# Patient Record
Sex: Male | Born: 1985 | Race: Black or African American | Hispanic: No | Marital: Single | Smoking: Current every day smoker
Health system: Southern US, Community
[De-identification: ages and names within clinical notes are randomized; demographics above are authoritative.]

## PROBLEM LIST (undated history)

## (undated) DIAGNOSIS — W3400XA Accidental discharge from unspecified firearms or gun, initial encounter: Secondary | ICD-10-CM

## (undated) DIAGNOSIS — S21339A Puncture wound without foreign body of unspecified front wall of thorax with penetration into thoracic cavity, initial encounter: Secondary | ICD-10-CM

---
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
## 2015-05-09 ENCOUNTER — Emergency Department (HOSPITAL_COMMUNITY)
Admission: EM | Admit: 2015-05-09 | Discharge: 2015-05-09 | Disposition: A | Discharge status: Home / Self Care | Payer: Self-pay | Attending: Emergency Medicine | Admitting: Emergency Medicine

## 2015-05-09 ENCOUNTER — Emergency Department (HOSPITAL_COMMUNITY): Payer: Self-pay

## 2015-05-09 ENCOUNTER — Encounter (HOSPITAL_COMMUNITY): Payer: Self-pay

## 2015-05-09 DIAGNOSIS — S62336A Displaced fracture of neck of fifth metacarpal bone, right hand, initial encounter for closed fracture: Secondary | ICD-10-CM | POA: Insufficient documentation

## 2015-05-09 DIAGNOSIS — W1839XA Other fall on same level, initial encounter: Secondary | ICD-10-CM | POA: Insufficient documentation

## 2015-05-09 DIAGNOSIS — Y9367 Activity, basketball: Secondary | ICD-10-CM | POA: Insufficient documentation

## 2015-05-09 DIAGNOSIS — S62334A Displaced fracture of neck of fourth metacarpal bone, right hand, initial encounter for closed fracture: Secondary | ICD-10-CM | POA: Insufficient documentation

## 2015-05-09 DIAGNOSIS — Y998 Other external cause status: Secondary | ICD-10-CM | POA: Insufficient documentation

## 2015-05-09 DIAGNOSIS — Z720 Tobacco use: Secondary | ICD-10-CM | POA: Insufficient documentation

## 2015-05-09 DIAGNOSIS — Y92310 Basketball court as the place of occurrence of the external cause: Secondary | ICD-10-CM | POA: Insufficient documentation

## 2015-05-09 MED ORDER — OXYCODONE-ACETAMINOPHEN 5-325 MG PO TABS
1.0000 | ORAL_TABLET | Freq: Once | ORAL | Status: AC
  Administered 2015-05-09: 1 via ORAL
  Filled 2015-05-09: qty 1

## 2015-05-09 MED ORDER — OXYCODONE-ACETAMINOPHEN 5-325 MG PO TABS: 2.0000 | ORAL_TABLET | Freq: Four times a day (QID) | ORAL | Status: DC | PRN

## 2015-05-09 MED ORDER — CEPHALEXIN 500 MG PO CAPS: 1000.0000 mg | ORAL_CAPSULE | Freq: Two times a day (BID) | ORAL | Status: DC

## 2015-05-09 NOTE — ED Provider Notes (Signed)
CSN: [id number]     Arrival date & time 05/09/15  1351 History  This chart was scribed for non-physician provider Fayrene Helper, PA-C, working with Laurence Spates, MD by Phillis Haggis, ED Scribe. This patient was seen in room WTR8/WTR8 and patient care was started at 3:16 PM.   Chief Complaint  Patient presents with  . Hand Injury   The history is provided by the patient. No language interpreter was used.  HPI Comments: Jay Young is a 29 y.o. male who presents to the Emergency Department complaining of a right hand injury onset two hours ago. Pt states that he was playing basketball when he went up for a dunk and fell, landing on the dorsal surface of the right hand. He reports bleeding and swelling to the dorsum of hand and rates pain 10/10, sharp and constant. Pt states that he is right hand dominant. He denies taking anything for the pain PTA. He denies hitting head, LOC, injuries to upper arm or wrist, back pain, numbness, or weakness. He denies getting into an altercation and denies that this is a punching injury. Pt states he is UTD on tdap and denies taking blood thinners.   History reviewed. No pertinent past medical history. History reviewed. No pertinent past surgical history. History reviewed. No pertinent family history. Social History  Substance Use Topics  . Smoking status: Current Every Day Smoker -- 0.50 packs/day    Types: Cigarettes  . Smokeless tobacco: None  . Alcohol Use: No    Review of Systems  Musculoskeletal: Positive for arthralgias. Negative for back pain.  Skin: Positive for wound.  Neurological: Negative for syncope, weakness, numbness and headaches.   Allergies  Review of patient's allergies indicates not on file.  Home Medications   Prior to Admission medications   Not on File   BP 155/73 mmHg  Pulse 89  Temp(Src) 98.3 F (36.8 C) (Oral)  Resp 18  SpO2 99%  Physical Exam  Constitutional: He is oriented to person, place, and time. He  appears well-developed and well-nourished. No distress.  HENT:  Head: Normocephalic and atraumatic.  Eyes: EOM are normal. Pupils are equal, round, and reactive to light.  Neck: Normal range of motion. Neck supple.  Pulmonary/Chest: Effort normal.  Musculoskeletal: Normal range of motion.  Marked edema noted to the dorsum of hand overlying the 4th and 5th metacarpal with small skin tear, actively bleeding. Tenderness to palpation most significant to 4th and 5th metacarpal with mild crepitus. Radial pulses 2+; wrist with normal ROM. Fingers non-tender  Neurological: He is alert and oriented to person, place, and time.  Skin: Skin is warm and dry. He is not diaphoretic.  Psychiatric: He has a normal mood and affect. His behavior is normal.  Nursing note and vitals reviewed.   ED Course  Procedures (including critical care time) DIAGNOSTIC STUDIES: Oxygen Saturation is 99% on RA, normal by my interpretation.    COORDINATION OF CARE: 3:20 PM-Discussed treatment plan which includes x-ray with pt at bedside and pt agreed to plan.   3:50 PM Pt has a boxer's fracture of his R hand involving 4th-5th metarcarpal bone.  Small amount of bleeding to dorsum of hand but appears superficial, not likely an open fracture, however i will provide prophylactic abx (Keflex).  I have also consulted hand Specialist Dr. Izora Ribas who recommend ulnar gutter splint and pt can f/u in office for further care.  Pt agrees with plan.    Filed Vitals:   05/09/15 1401  BP: 155/73  Pulse: 89  Temp: 98.3 F (36.8 C)  TempSrc: Oral  Resp: 18  SpO2: 99%   Labs Review Labs Reviewed - No data to display  Imaging Review Dg Hand Complete Right  05/09/2015   CLINICAL DATA:  Basketball injury.  Pain.  EXAM: RIGHT HAND - COMPLETE 3+ VIEW  COMPARISON:  None.  FINDINGS: Comminuted fractures of the distal fourth and fifth metacarpals with angulation, consistent with boxer's fractures. Soft tissue swelling. MCP joints are  intact.  IMPRESSION: Boxer's fractures of the distal fourth and fifth metacarpals with angulation. Soft tissue swelling.   Electronically Signed   By: Elsie Stain M.D.   On: 05/09/2015 15:22      EKG Interpretation None      MDM   Final diagnoses:  None   Pt is a 29 year old male who presents to the ED following a fall and left hand injury. Pt reports 10/10 pain with laceration and swelling to the 4th and 5th metacarpals. Upon x-ray, hand shows a boxer's fracture to both metacarpals. Will call hand surgery for referral. Discussed return precautions with pt and pt agreed to plan.   BP 155/73 mmHg  Pulse 89  Temp(Src) 98.3 F (36.8 C) (Oral)  Resp 18  SpO2 99%  I have reviewed nursing notes and vital signs. I personally viewed the imaging tests through PACS system and agrees with radiologist's intepretation I reviewed available ER/hospitalization records through the EMR  I personally performed the services described in this documentation, which was scribed in my presence. The recorded information has been reviewed and is accurate.     Fayrene Helper, PA-C 05/09/15 1556  Laurence Spates, MD 05/09/15 [phone]

## 2015-05-09 NOTE — Discharge Instructions (Signed)
Please follow up closely with hand specialist Dr. Debby Bud office next week for further management of your broken hand injury.  Follow instruction below.   Boxer's Fracture Boxer's fracture is a broken bone (fracture) of the fourth or fifth metacarpal (ring or pinky finger). The metacarpal bones connect the wrist to the fingers and make up the arch of the hand. Boxer's fracture occurs toward the body (proximal) from the first knuckle. This injury is known as a boxer's fracture, because it often occurs from hitting an object with a closed fist. SYMPTOMS   Severe pain at the time of injury.  Pain and swelling around the first knuckle on the fourth or fifth finger.  Bruising (contusion) in the area within 48 hours of injury.  Visible deformity, such as a pushed down knuckle. This can occur if the fracture is complete, and the bone fragments separate enough to distort normal body shape.  Numbness or paralysis from swelling in the hand, causing pressure on the blood vessels or nerves (uncommon). CAUSES   Direct injury (trauma), such as a striking blow with the fist.  Indirect stress to the hand, such as twisting or violent muscle contraction (uncommon). RISK INCREASES WITH:  Punching an object with an unprotected knuckle.  Contact sports (football, rugby).  Sports that require hitting (boxing, martial arts).  History of bone or joint disease (osteoporosis). PREVENTION  Maintain physical fitness:  Muscle strength and flexibility.  Endurance.  Cardiovascular fitness.  For participation in contact sports, wear proper protective equipment for the hand and make sure it fits properly.  Learn and use proper technique when hitting or punching. PROGNOSIS  When proper treatment is given, to ensure normal alignment of the bones, healing can usually be expected in 4 to 6 weeks. Occasionally, surgery is necessary.  RELATED COMPLICATIONS   Bone does not heal back together  (nonunion).  Bone heals together in an improper position (malunion), causing twisting of the finger when making a fist.  Chronic pain, stiffness, or swelling of the hand.  Excessive bleeding in the hand, causing pressure and injury to nerves and blood vessels (rare).  Stopping of normal hand growth in children.  Infection of the wound, if skin is broken over the fracture (open fracture), or at the incision site if surgery is performed.  Shortening of injured bones.  Bony bump (prominence) in the palm or loss of shape of the knuckles.  Pain and weakness when gripping.  Arthritis of the affected joint, if the fracture goes into the joint, after repeated injury, or after delayed treatment.  Scarring around the knuckle, and limited motion. TREATMENT  Treatment varies, depending on the injury. The place in the hand where the injury occurs has a great deal of motion, which allows the hand to move properly. If the fracture is not aligned properly, this function may be decreased. If the bone ends are in proper alignment, treatment first involves ice and elevation of the injured hand, at or above heart level, to reduce inflammation. Pain medicines help to relieve pain. Treatment also involves restraint by splinting, bandaging, casting, or bracing for 4 or more weeks.  If the fracture is out of alignment (displaced), or it involves the joint, surgery is usually recommended. Surgery typically involves cutting through the skin to place removable pins, screws, and sometimes plates over the fracture. After surgery, the bone and joint are restrained for 4 or more weeks. After restraint (with or without surgery), stretching and strengthening exercises are needed to regain proper strength  and function of the joint. Exercises may be done at home or with the assistance of a therapist. Depending on the sport and position played, a brace or splint may be recommended when first returning to sports.  MEDICATION    If pain medication is necessary, nonsteroidal anti-inflammatory medications, such as aspirin and ibuprofen, or other minor pain relievers, such as acetaminophen, are often recommended.  Do not take pain medication for 7 days before surgery.  Prescription pain relievers may be necessary. Use only as directed and only as much as you need. COLD THERAPY Cold treatment (icing) relieves pain and reduces inflammation. Cold treatment should be applied for 10 to 15 minutes every 2 to 3 hours for inflammation and pain, and immediately after any activity that aggravates your symptoms. Use ice packs or an ice massage. SEEK MEDICAL CARE IF:   Pain, tenderness, or swelling gets worse, despite treatment.  You experience pain, numbness, or coldness in the hand.  Blue, gray, or dark color appears in the fingernails.  You develop signs of infection, after surgery (fever, increased pain, swelling, redness, drainage of fluids, or bleeding in the surgical area).  You feel you have reinjured the hand.  New, unexplained symptoms develop. (Drugs used in treatment may produce side effects.) Document Released: 08/15/2005 Document Revised: 11/07/2011 Document Reviewed: 11/27/2008 [facility] Patient Information 2015 [place], [place]. This information is not intended to replace advice given to you by your health care provider. Make sure you discuss any questions you have with your health care provider.

## 2015-05-09 NOTE — Progress Notes (Signed)
Pt stated he fell while playing basketball and has pain in his hand primarily the 4th and pinky finger. Pt is s/p a hand x-ray in the dept. Ice pack applied and pt instructed to continue to keep his hand elevated. Positive capillary refill . Fingers are cool secondary to the ice pack.

## 2015-05-09 NOTE — ED Notes (Addendum)
Pt c/o L hand pain/injury starting this afternoon.  Pain score 9/10.  Pt reports that he fell while attempting a dunk.  Pt has not taken anything for pain.  Swelling noted to posterior hand.  Small puncture wound noted to posterior hand.  Dressing applied.

## 2015-07-02 ENCOUNTER — Emergency Department (HOSPITAL_COMMUNITY): Payer: Self-pay

## 2015-07-02 ENCOUNTER — Encounter (HOSPITAL_COMMUNITY): Payer: Self-pay | Admitting: Emergency Medicine

## 2015-07-02 ENCOUNTER — Emergency Department (HOSPITAL_COMMUNITY)
Admission: EM | Admit: 2015-07-02 | Discharge: 2015-07-02 | Disposition: A | Discharge status: Home / Self Care | Payer: Self-pay | Attending: Emergency Medicine | Admitting: Emergency Medicine

## 2015-07-02 DIAGNOSIS — Z4800 Encounter for change or removal of nonsurgical wound dressing: Secondary | ICD-10-CM | POA: Insufficient documentation

## 2015-07-02 DIAGNOSIS — Z720 Tobacco use: Secondary | ICD-10-CM | POA: Insufficient documentation

## 2015-07-02 DIAGNOSIS — R05 Cough: Secondary | ICD-10-CM | POA: Insufficient documentation

## 2015-07-02 DIAGNOSIS — R079 Chest pain, unspecified: Secondary | ICD-10-CM | POA: Insufficient documentation

## 2015-07-02 DIAGNOSIS — Z792 Long term (current) use of antibiotics: Secondary | ICD-10-CM | POA: Insufficient documentation

## 2015-07-02 DIAGNOSIS — Z5189 Encounter for other specified aftercare: Secondary | ICD-10-CM

## 2015-07-02 DIAGNOSIS — R059 Cough, unspecified: Secondary | ICD-10-CM

## 2015-07-02 HISTORY — DX: Puncture wound without foreign body of unspecified front wall of thorax with penetration into thoracic cavity, initial encounter: S21.339A

## 2015-07-02 HISTORY — DX: Accidental discharge from unspecified firearms or gun, initial encounter: W34.00XA

## 2015-07-02 MED ORDER — OXYCODONE-ACETAMINOPHEN 5-325 MG PO TABS: 1.0000 | ORAL_TABLET | Freq: Four times a day (QID) | ORAL | Status: DC | PRN

## 2015-07-02 MED ORDER — OXYCODONE-ACETAMINOPHEN 5-325 MG PO TABS
2.0000 | ORAL_TABLET | Freq: Once | ORAL | Status: AC
  Administered 2015-07-02: 2 via ORAL
  Filled 2015-07-02: qty 2

## 2015-07-02 MED ORDER — IBUPROFEN 800 MG PO TABS
800.0000 mg | ORAL_TABLET | Freq: Once | ORAL | Status: AC
  Administered 2015-07-02: 800 mg via ORAL
  Filled 2015-07-02: qty 1
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
## 2015-07-16 ENCOUNTER — Emergency Department (HOSPITAL_COMMUNITY)
Admission: EM | Admit: 2015-07-16 | Discharge: 2015-07-16 | Disposition: A | Discharge status: Home / Self Care | Payer: Self-pay | Attending: Emergency Medicine | Admitting: Emergency Medicine

## 2015-07-16 ENCOUNTER — Emergency Department (HOSPITAL_COMMUNITY): Payer: Self-pay

## 2015-07-16 ENCOUNTER — Encounter (HOSPITAL_COMMUNITY): Payer: Self-pay | Admitting: Emergency Medicine

## 2015-07-16 DIAGNOSIS — Z5189 Encounter for other specified aftercare: Secondary | ICD-10-CM

## 2015-07-16 DIAGNOSIS — Z87828 Personal history of other (healed) physical injury and trauma: Secondary | ICD-10-CM | POA: Insufficient documentation

## 2015-07-16 DIAGNOSIS — F17210 Nicotine dependence, cigarettes, uncomplicated: Secondary | ICD-10-CM | POA: Insufficient documentation

## 2015-07-16 DIAGNOSIS — Z4801 Encounter for change or removal of surgical wound dressing: Secondary | ICD-10-CM | POA: Insufficient documentation
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
## 2015-07-16 NOTE — Discharge Instructions (Signed)
Wash the affected area with soap and water and apply a thin layer of topical antibiotic ointment. Do this every 12 hours.  ° °Do not use rubbing alcohol or hydrogen peroxide.                       ° °Look for signs of infection: if you see redness, if the area becomes warm, if pain increases sharply, there is discharge (pus), if red streaks appear or you develop fever or vomiting, RETURN immediately to the Emergency Department  for a recheck.  ° °Do not hesitate to return to the emergency room for any new, worsening or concerning symptoms. ° °Please obtain primary care using resource guide below. Let them know that you were seen in the emergency room and that they will need to obtain records for further outpatient management. ° ° ° °Emergency Department Resource Guide °1) Find a Doctor and Pay Out of Pocket °Although you won't have to find out who is covered by your insurance plan, it is a good idea to ask around and get recommendations. You will then need to call the office and see if the doctor you have chosen will accept you as a new patient and what types of options they offer for patients who are self-pay. Some doctors offer discounts or will set up payment plans for their patients who do not have insurance, but you will need to ask so you aren't surprised when you get to your appointment. ° °2) Contact Your Local Health Department °Not all health departments have doctors that can see patients for sick visits, but many do, so it is worth a call to see if yours does. If you don't know where your local health department is, you can check in your phone book. The CDC also has a tool to help you locate your state's health department, and many state websites also have listings of all of their local health departments. ° °3) Find a Walk-in Clinic °If your illness is not likely to be very severe or complicated, you may want to try a walk in clinic. These are popping up all over the country in pharmacies, drugstores,  and shopping centers. They're usually staffed by nurse practitioners or physician assistants that have been trained to treat common illnesses and complaints. They're usually fairly quick and inexpensive. However, if you have serious medical issues or chronic medical problems, these are probably not your best option. ° °No Primary Care Doctor: °- Call Health Connect at  832-8000 - they can help you locate a primary care doctor that  accepts your insurance, provides certain services, etc. °- Physician Referral Service- 1-800-533-3463 ° °Chronic Pain Problems: °Organization         Address  Phone   Notes  °[facility]  ([phone] Patients need to be referred by their primary care doctor.  ° °Medication Assistance: °Organization         Address  Phone   Notes  °[place] [facility] [address] °[place], [place] [address] [phone] --Must be a resident of [place] °-- Must have NO insurance coverage whatsoever (no Medicaid/ Medicare, etc.) °-- The pt. MUST have a primary care doctor that directs their care regularly and follows them in the community °  °MedAssist  (866) 331-1348   °United Way  (888) 892-1162   ° °Agencies that provide inexpensive medical care: °Organization           Address  Phone   Notes  [facility]  [phone]   [facility]    [phone]   [facility] [address] [place], [place] [address] [phone]   [facility] [address] [place], [place] [phone]   Planned Parenthood    [phone]   [facility]    [phone]   [facility]  [address], [place] Phone:  [phone], Fax:  [phone] Hours of Operation:  9 am - 6 pm, M-F.  Also accepts Medicaid/Medicare and self-pay.  [facility]  [address], [place] Phone: [phone], Fax: [phone]. Hours of Operation:  8:30 am - 5:30 pm, M-F.  Also accepts Medicaid and self-pay.  [facility] [place] [address] [place] Phone: [phone]   [facility] [address] Natasha Bence [place], [place] [phone], Ext. [phone] Mondays & Thursdays: 7-9 AM.  First 15 patients are seen on a first come, first serve basis.    Medicaid-accepting [facility] Providers:  Organization         Address  Phone   Notes  [facility] [address], [place] [phone] Also accepts self-pay patients.  [facility] [address] Laurell Josephs [place], [place]  [phone]   [facility] [address], [place] [phone]   [facility] [address] [place] [phone]   Renaye Rakers [address], [place]   [phone] Only accepts [place] patients after they have their name applied to their card.   Self-Pay (no insurance) in [facility]:  Organization         Address  Phone   Notes  Sickle Cell Patients, [facility] [address] [place], [place] [phone]   [facility] [address] [place], [place] [phone]   [facility] [place]  [address], [place] [phone]   [facility]/Dr. Osei-Bonsu  [address] [place] or [address], [place] [phone] Phone number for both [place] and [place] locations is the same.  [facility] [address] [place] [phone]   [facility] [address] [place] or [address] [phone]   [facility] [address] [place] [phone], phone; [phone], fax Sees patients 1st and 3rd Saturday of every month.  Must not qualify for public or  private insurance (i.e. Medicaid, Medicare, [place] Health Choice, Veterans' Benefits)  Household income should be no more than 200% of the poverty level The clinic cannot treat you if you are pregnant or think you are pregnant  Sexually transmitted diseases are not treated at the clinic.    Dental Care: Organization         Address  Phone  Notes  [facility] [address] [place], [place] [phone] Accepts children up to age 51 who are enrolled in [place] or [place] Health Choice; pregnant women with a Medicaid card; and children who have applied for Medicaid or  Health Choice, but were declined, whose parents can pay a reduced fee at time  of service.  [facility]  [address], [place] [phone] Accepts children up to age 61 who are enrolled in [place] or [place] Health Choice; pregnant women with a Medicaid card; and children who have applied for Medicaid or [place] Health Choice, but were declined, whose parents can pay a reduced fee at time of service.  [facility]  [address] [place], [place] [phone] Patients are seen by appointment only. Walk-ins are not accepted. [facility] will see patients 53 years of age and older. Monday - Tuesday (8am-5pm) Most Wednesdays (8:30-5pm) $30 per visit, cash only  [facility]  [address], [facility] [phone] Patients are seen by appointment only. Walk-ins are not accepted. [facility] will see patients 66 years of age and older. One Wednesday Evening (Monthly: Volunteer Based).  $30 per visit, cash only  [employer]  [phone] for adults; Children under age 30, call Graduate Pediatric Dentistry at [phone]. Children aged 85-14, please call [phone] to request a pediatric application.  Dental services are provided in all areas of dental  care including fillings, crowns and bridges, complete and partial dentures, implants, gum treatment, root canals, and extractions. Preventive care is also provided. Treatment is provided to both adults and children. Patients are selected via a lottery and there is often a waiting list.   [facility] [address], [place]  [phone] [email or web address]   [facility] [address] [place], [place] [phone], Ext. [phone] Second and Fourth Thursday of each month, opens at 6:30 AM; Clinic ends at 9 AM.  Patients are seen on a first-come first-served basis, and a limited number are seen during each clinic.   [facility]  [address] Ether Griffins [place], [place] [phone]   Eligibility Requirements You must have lived in [place], [place], or [place] counties for at least the last three months.   You cannot be eligible for state or federal sponsored [place], including [employer], [place], or [employer].   You generally cannot be eligible for healthcare insurance through your employer.    How to apply: Eligibility screenings are held every Tuesday and Wednesday afternoon from 1:00 pm until 4:00 pm. You do not need an appointment for the interview!  [facility] [address] [place], [place] [phone]   [facility] Health Department  [phone]   [facility] Health Department  [phone]   [facility] Health Department  [phone]    Behavioral Health Resources in the Community: Intensive Outpatient Programs Organization         Address  Phone  Notes  [facility] [address], [place], [place] [phone]   [facility] [address], [place], [place] [phone]   [facility] [address], [place], [place]  [phone]   [facility] [address],  [employer], [place] [phone] or [phone]    Substance Abuse Resources Organization         Address  Phone  Notes  [facility]  8195055341   [facility]  [phone]   [facility] [place]  [phone]   Floydene Flock  [phone]   Residential & Outpatient Substance Abuse Program  [phone]   Psychological Services Organization         Address  Phone  Notes  [employer] [facility]  [phone]   [facility] Services  [phone]   [facility] [address] [place] [phone] or [phone]    Mobile Crisis Teams Organization         Address  Phone  Notes  [facility], Mobile Crisis Care Unit  [phone]   [facility]  [address] [place], [place] [phone]   Doristine Locks [address] [place] [address] [place] [phone]    Self-Help/Support Groups Organization         Address  Phone             Notes  [facility] [place] - variety of support groups  336- I7437963 Call for more information  Narcotics Anonymous (NA), [facility] [address], [employer] Hope  2 meetings at this location   Statistician         Address  Phone  Notes  [facility] [address],    [place]  [phone]   [facility]  [address], [place] [address], [place], [place] [phone]   [facility] [address] [place], [place] [phone] Admissions: 8am-3pm M-F  [facility] [address]    [place], [place] [phone]   [facility] [address] [place], [place], [place] [phone]   [facility] [address]  [place], [place] [phone]   [facility] - Intensive Outpatient [address] Laurell Josephs [address], [place], [place] [phone]   [facility] ([facility] [address] [place]  [place], [place] [phone] or [phone]   [facility] ([facility]) [address], [place], [place] [phone] Accepts Medicaid  [facility] [place] [address]  [place] [phone] Substance Abuse/Addiction Treatment   [facility] Organization         Address  Phone  Notes  [facility]  2345358843   Angie Fava, PhD [address] Ervin Knack [place], [place]   [phone] or [phone]   [facility] Behavioral   [address] [place], [place] [phone]   [facility] [address], [place], [place] [phone] Insurance/Medicaid/sponsorship through [facility] and Families [address]                                    [place], [place] [phone] Therapy/tele-psych/case  [facility] [address], [place] [phone]    Dr. Lolly Mustache  [phone]   [facility]  United [employer] [facility] Dept. 1) [address] [place] 2) [address] [place] 3)  [address], [place] [phone]  [phone]   [facility] Child Abuse Hotline 929-469-9191 or 5613528226 (After Hours)
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
## 2015-07-19 ENCOUNTER — Emergency Department (HOSPITAL_COMMUNITY)
Admission: EM | Admit: 2015-07-19 | Discharge: 2015-07-19 | Disposition: A | Discharge status: Home / Self Care | Payer: Self-pay | Attending: Emergency Medicine | Admitting: Emergency Medicine

## 2015-07-19 ENCOUNTER — Encounter (HOSPITAL_COMMUNITY): Payer: Self-pay | Admitting: Emergency Medicine

## 2015-07-19 DIAGNOSIS — M25511 Pain in right shoulder: Secondary | ICD-10-CM

## 2015-07-19 DIAGNOSIS — Y230XXD Shotgun discharge, undetermined intent, subsequent encounter: Secondary | ICD-10-CM | POA: Insufficient documentation

## 2015-07-19 DIAGNOSIS — Z760 Encounter for issue of repeat prescription: Secondary | ICD-10-CM | POA: Insufficient documentation

## 2015-07-19 DIAGNOSIS — W3400XA Accidental discharge from unspecified firearms or gun, initial encounter: Secondary | ICD-10-CM

## 2015-07-19 DIAGNOSIS — S41001D Unspecified open wound of right shoulder, subsequent encounter: Secondary | ICD-10-CM | POA: Insufficient documentation

## 2015-07-19 DIAGNOSIS — F17210 Nicotine dependence, cigarettes, uncomplicated: Secondary | ICD-10-CM | POA: Insufficient documentation

## 2015-07-19 HISTORY — DX: Accidental discharge from unspecified firearms or gun, initial encounter: W34.00XA

## 2015-07-19 MED ORDER — OXYCODONE-ACETAMINOPHEN 5-325 MG PO TABS
2.0000 | ORAL_TABLET | Freq: Once | ORAL | Status: AC
  Administered 2015-07-19: 2 via ORAL
  Filled 2015-07-19: qty 2

## 2015-07-19 MED ORDER — OXYCODONE-ACETAMINOPHEN 5-325 MG PO TABS: 1.0000 | ORAL_TABLET | ORAL | Status: AC | PRN
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
## 2015-07-19 NOTE — Discharge Instructions (Signed)
You have been seen today for a medication refill. Follow up with PCP as soon as possible. Return to ED should symptoms worsen.   Emergency Department Resource Guide 1) Find a Doctor and Pay Out of Pocket Although you won't have to find out who is covered by your insurance plan, it is a good idea to ask around and get recommendations. You will then need to call the office and see if the doctor you have chosen will accept you as a new patient and what types of options they offer for patients who are self-pay. Some doctors offer discounts or will set up payment plans for their patients who do not have insurance, but you will need to ask so you aren't surprised when you get to your appointment.  2) Contact Your Local Health Department Not all health departments have doctors that can see patients for sick visits, but many do, so it is worth a call to see if yours does. If you don't know where your local health department is, you can check in your phone book. The CDC also has a tool to help you locate your state's health department, and many state websites also have listings of all of their local health departments.  3) Find a Walk-in Clinic If your illness is not likely to be very severe or complicated, you may want to try a walk in clinic. These are popping up all over the country in pharmacies, drugstores, and shopping centers. They're usually staffed by nurse practitioners or physician assistants that have been trained to treat common illnesses and complaints. They're usually fairly quick and inexpensive. However, if you have serious medical issues or chronic medical problems, these are probably not your best option.  No Primary Care Doctor: - Call Health Connect at  [phone] - they can help you locate a primary care doctor that  accepts your insurance, provides certain services, etc. - Physician Referral Service- [phone]  Chronic Pain Problems: Organization         Address  Phone    Notes  [facility]  [phone] Patients need to be referred by their primary care doctor.   Medication Assistance: Organization         Address  Phone   Notes  [facility] [address] [place], [place] [address] [phone] --Must be a resident of [place] -- Must have NO insurance coverage whatsoever (no Medicaid/ Medicare, etc.) -- The pt. MUST have a primary care doctor that directs their care regularly and follows them in the community   [facility]  [phone]   [employer]  [phone]    Agencies that provide inexpensive medical care: Organization         Address  Phone   Notes  [facility]  [phone]   [facility]    [phone]   [facility] [address] [place], [place] [address] [phone]   [facility] [address], [place] [phone]   Planned Parenthood    [phone]   [facility]    [phone]   [facility]  [address], [place] Phone:  [phone], Fax:  [phone] Hours of Operation:  9 am - 6 pm, M-F.  Also accepts Medicaid/Medicare and self-pay.  [facility]  [address], [place] Phone: [phone]  [phone], Fax: [phone]. Hours of Operation:  8:30 am - 5:30 pm, M-F.  Also accepts Medicaid and self-pay.  [facility] [place] [address] [place] Phone: [phone]   [place], [place], [place] [phone], Ext. [phone] Mondays & Thursdays: 7-9 AM.  First 15 patients are seen on a first come, first serve basis.    [place] Providers:  Organization         Address  Phone   Notes  [facility] [address], [place] [phone] Also accepts self-pay patients.  [facility]  [id number] [place], [place]  [phone]   [place], [address], [place] [phone]   [facility] [address] [place] [phone]   Lucianne Lei [address], [place]   [phone] Only accepts [place] Access [place] patients after they have their name applied to their card.   Self-Pay (no insurance) in [facility]:  Organization         Address  Phone   Notes  Sickle Cell Patients, [facility] [place] [phone]   [facility] [place] [phone]   [facility] [place]  [place], [place], [place] [phone]   [facility]/Dr. Osei-Bonsu  [address], [place] or [address], [place] [phone] Phone number for both Dent and [place] locations is the same.  [facility] [address], [place] [phone]   [facility] [address] [place] or [address] [phone]   [facility] [address] [place] [phone], phone; [phone], fax Sees patients 1st and 3rd Saturday of every month.  Must not qualify for public or private insurance (i.e. Medicaid, Medicare, [place] Health Choice, Veterans' Benefits)  Household income should be no more than 200% of the poverty level The clinic cannot treat you if you are pregnant or think you are pregnant  Sexually transmitted diseases are not treated at the clinic.    Dental Care: Organization         Address  Phone  Notes  [facility] [place] [phone] Accepts children up to age 49 who are enrolled in [place] or [place]; pregnant women with a Medicaid card; and children who have  applied for Medicaid or [place] Health Choice, but were declined, whose parents can pay a reduced fee at time of service.  [facility] Department of [facility]  [address], [place] [phone] Accepts children up to age 43 who are enrolled in [place] or [place]; pregnant women with a Medicaid card; and children who have applied for Medicaid or [place] Health Choice, but were declined, whose parents can pay a reduced fee at time of service.  [facility]  [place] [phone] Patients are seen by appointment only. Walk-ins are not accepted. [place] will see patients 75 years of age and older. Monday - Tuesday (8am-5pm) Most Wednesdays (8:30-5pm) $30 per visit, cash only  [facility]  [address], [employer]  [place] [phone] Patients are seen by appointment only. Walk-ins are not accepted. [place] will see patients 52 years of age and older. One Wednesday Evening (Monthly: Volunteer Based).  $30 per visit, cash only  [place]  [phone] for adults; Children under age 58, call Graduate Pediatric Dentistry at [phone]. Children aged 77-14, please call [phone] to request a pediatric application.  Dental services are provided in all areas of dental care including fillings, crowns and bridges, complete and partial dentures, implants, gum treatment, root canals, and extractions. Preventive care is also provided. Treatment is provided to both adults and children. Patients are selected via a lottery and there is often a waiting list.   [facility] [address], [place]  [phone] [email or web address]   [facility] [address] [place], [place] [phone], Ext. [phone] Second and Fourth Thursday of each month, opens at 6:30 AM; Clinic ends at 9 AM.  Patients are seen on a first-come first-served basis, and a  limited number are seen during each clinic.   [facility]  [address] Hillard Danker [place], [place] [phone]   Eligibility Requirements You must have lived in [place], [place], or [place] counties for at least the last three months.   You cannot be eligible for state or federal sponsored [employer], including [employer], [place], or [employer].   You generally cannot be eligible for healthcare insurance through your employer.    How to apply: Eligibility screenings are held every Tuesday and Wednesday afternoon from 1:00 pm until 4:00 pm. You do not need an appointment for the interview!  [facility] [address], [place], [place]   [place]  [place] Department  [place]  [phone]    Behavioral Health Resources in the Community: Intensive Outpatient Programs Organization         Address  Phone  Notes  [place]. [address], [place], [place] [phone]   [facility] [address] [place], [place]   [facility]: [facility] [address] [place], [place]   [place] [address]  [place], [place] or [phone]   Substance Abuse Resources Organization         Address  Phone  Notes  [facility] Drug Services  [phone]   [place]  [phone]   [facility] [place]   Chinita Pester  [phone]   Residential & Outpatient Substance Abuse Program  [phone]   Psychological Services Organization         Address  Phone  Notes  [facility] [place]  [place]  [phone]   [place] [address] [place] or [phone]    Mobile Crisis Teams Organization          Address  Phone  Notes  [facility], Mobile Crisis Care Unit  [phone]   [facility]  [address] [place], [place]   [place] [address] [place] [phone]    Self-Help/Support Groups Organization         Address  Phone             Notes  [place]. of [place] - variety of support groups  [phone] Call for more information  Narcotics Anonymous (NA), [facility] [address], [employer] [place]  2 meetings at this location   Residential Facilities manager         Address  Phone  Notes  [facility] [place],    [place]  [phone]   [facility]  [address], [place] [id number], [place], [place]   [place], [place] [phone] Admissions: 8am-3pm M-F  Incentives Substance [place] [address]    [employer], [place] [phone]   [facility] [address] [place], [place], [place]   [facility] [address],  [place], [place]   [facility] - Intensive Outpatient [address] Kristeen [address], [place], [place]   [facility] ([place].,  [place], [place] [phone] or [phone]   [facility] ([facility]) [address], [place], [place] Accepts Medicaid  [facility] [address]  [place] [phone] Substance Abuse/Addiction Treatment   [facility] Organization         Address  Phone  Notes  [facility]  [phone]   Domenic Schwab, PhD [address] Arlis Porta [place], [place]   [phone] or [phone]   [place], [place] [phone]   [facility] [address], [place], [place] [phone] Insurance/Medicaid/sponsorship  through [facility] [address]                                    [place], [place] [phone] [place] [address], [place] [phone]    Dr. Adele Schilder  [phone]   [facility] Dept. 1) [address] [place] 2) [place] 3)  [place] [address], [place] [phone]  [phone]   [place] [phone] or [phone] (After Hours)

## 2015-07-19 NOTE — ED Notes (Signed)
Patient was alert, oriented and stable upon discharge. RN went over AVS and patient had no further questions.  
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
## 2015-09-08 ENCOUNTER — Encounter (HOSPITAL_COMMUNITY): Payer: Self-pay | Admitting: *Deleted

## 2015-09-08 ENCOUNTER — Emergency Department (HOSPITAL_COMMUNITY)
Admission: EM | Admit: 2015-09-08 | Discharge: 2015-09-08 | Disposition: A | Discharge status: Home / Self Care | Payer: Self-pay | Attending: Emergency Medicine | Admitting: Emergency Medicine

## 2015-09-08 DIAGNOSIS — Y998 Other external cause status: Secondary | ICD-10-CM | POA: Insufficient documentation

## 2015-09-08 DIAGNOSIS — Y9289 Other specified places as the place of occurrence of the external cause: Secondary | ICD-10-CM | POA: Insufficient documentation

## 2015-09-08 DIAGNOSIS — Y9389 Activity, other specified: Secondary | ICD-10-CM | POA: Insufficient documentation

## 2015-09-08 DIAGNOSIS — F17210 Nicotine dependence, cigarettes, uncomplicated: Secondary | ICD-10-CM | POA: Insufficient documentation

## 2015-09-08 DIAGNOSIS — S46911A Strain of unspecified muscle, fascia and tendon at shoulder and upper arm level, right arm, initial encounter: Secondary | ICD-10-CM | POA: Insufficient documentation

## 2015-09-08 MED ORDER — IBUPROFEN 800 MG PO TABS: 800.0000 mg | ORAL_TABLET | Freq: Three times a day (TID) | ORAL | Status: AC

## 2015-09-08 MED ORDER — IBUPROFEN 800 MG PO TABS
800.0000 mg | ORAL_TABLET | Freq: Once | ORAL | Status: AC
  Administered 2015-09-08: 800 mg via ORAL
  Filled 2015-09-08: qty 1

## 2015-09-08 NOTE — Discharge Instructions (Signed)
Muscle Strain °A muscle strain is an injury that occurs when a muscle is stretched beyond its normal length. Usually a small number of muscle fibers are torn when this happens. Muscle strain is rated in degrees. First-degree strains have the least amount of muscle fiber tearing and pain. Second-degree and third-degree strains have increasingly more tearing and pain.  °Usually, recovery from muscle strain takes 1-2 weeks. Complete healing takes 5-6 weeks.  °CAUSES  °Muscle strain happens when a sudden, violent force placed on a muscle stretches it too far. This may occur with lifting, sports, or a fall.  °RISK FACTORS °Muscle strain is especially common in athletes.  °SIGNS AND SYMPTOMS °At the site of the muscle strain, there may be: °· Pain. °· Bruising. °· Swelling. °· Difficulty using the muscle due to pain or lack of normal function. °DIAGNOSIS  °Your health care provider will perform a physical exam and ask about your medical history. °TREATMENT  °Often, the best treatment for a muscle strain is resting, icing, and applying cold compresses to the injured area.   °HOME CARE INSTRUCTIONS  °· Use the PRICE method of treatment to promote muscle healing during the first 2-3 days after your injury. The PRICE method involves: °· Protecting the muscle from being injured again. °· Restricting your activity and resting the injured body part. °· Icing your injury. To do this, put ice in a plastic bag. Place a towel between your skin and the bag. Then, apply the ice and leave it on from 15-20 minutes each hour. After the third day, switch to moist heat packs. °· Apply compression to the injured area with a splint or elastic bandage. Be careful not to wrap it too tightly. This may interfere with blood circulation or increase swelling. °· Elevate the injured body part above the level of your heart as often as you can. °· Only take over-the-counter or prescription medicines for pain, discomfort, or fever as directed by your  health care provider. °· Warming up prior to exercise helps to prevent future muscle strains. °SEEK MEDICAL CARE IF:  °· You have increasing pain or swelling in the injured area. °· You have numbness, tingling, or a significant loss of strength in the injured area. °MAKE SURE YOU:  °· Understand these instructions. °· Will watch your condition. °· Will get help right away if you are not doing well or get worse. °  °This information is not intended to replace advice given to you by your health care provider. Make sure you discuss any questions you have with your health care provider. °  °Document Released: 08/15/2005 Document Revised: 06/05/2013 Document Reviewed: 03/14/2013 °Elsevier Interactive Patient Education ©2016 Elsevier Inc. ° °Cryotherapy °Cryotherapy means treatment with cold. Ice or gel packs can be used to reduce both pain and swelling. Ice is the most helpful within the first 24 to 48 hours after an injury or flare-up from overusing a muscle or joint. Sprains, strains, spasms, burning pain, shooting pain, and aches can all be eased with ice. Ice can also be used when recovering from surgery. Ice is effective, has very few side effects, and is safe for most people to use. °PRECAUTIONS  °Ice is not a safe treatment option for people with: °· Raynaud phenomenon. This is a condition affecting small blood vessels in the extremities. Exposure to cold may cause your problems to return. °· Cold hypersensitivity. There are many forms of cold hypersensitivity, including: °¨ Cold urticaria. Red, itchy hives appear on the skin when the   tissues begin to warm after being iced. °¨ Cold erythema. This is a red, itchy rash caused by exposure to cold. °¨ Cold hemoglobinuria. Red blood cells break down when the tissues begin to warm after being iced. The hemoglobin that carry oxygen are passed into the urine because they cannot combine with blood proteins fast enough. °· Numbness or altered sensitivity in the area being  iced. °If you have any of the following conditions, do not use ice until you have discussed cryotherapy with your caregiver: °· Heart conditions, such as arrhythmia, angina, or chronic heart disease. °· High blood pressure. °· Healing wounds or open skin in the area being iced. °· Current infections. °· Rheumatoid arthritis. °· Poor circulation. °· Diabetes. °Ice slows the blood flow in the region it is applied. This is beneficial when trying to stop inflamed tissues from spreading irritating chemicals to surrounding tissues. However, if you expose your skin to cold temperatures for too long or without the proper protection, you can damage your skin or nerves. Watch for signs of skin damage due to cold. °HOME CARE INSTRUCTIONS °Follow these tips to use ice and cold packs safely. °· Place a dry or damp towel between the ice and skin. A damp towel will cool the skin more quickly, so you may need to shorten the time that the ice is used. °· For a more rapid response, add gentle compression to the ice. °· Ice for no more than 10 to 20 minutes at a time. The bonier the area you are icing, the less time it will take to get the benefits of ice. °· Check your skin after 5 minutes to make sure there are no signs of a poor response to cold or skin damage. °· Rest 20 minutes or more between uses. °· Once your skin is numb, you can end your treatment. You can test numbness by very lightly touching your skin. The touch should be so light that you do not see the skin dimple from the pressure of your fingertip. When using ice, most people will feel these normal sensations in this order: cold, burning, aching, and numbness. °· Do not use ice on someone who cannot communicate their responses to pain, such as small children or people with dementia. °HOW TO MAKE AN ICE PACK °Ice packs are the most common way to use ice therapy. Other methods include ice massage, ice baths, and cryosprays. Muscle creams that cause a cold, tingly  feeling do not offer the same benefits that ice offers and should not be used as a substitute unless recommended by your caregiver. °To make an ice pack, do one of the following: °· Place crushed ice or a bag of frozen vegetables in a sealable plastic bag. Squeeze out the excess air. Place this bag inside another plastic bag. Slide the bag into a pillowcase or place a damp towel between your skin and the bag. °· Mix 3 parts water with 1 part rubbing alcohol. Freeze the mixture in a sealable plastic bag. When you remove the mixture from the freezer, it will be slushy. Squeeze out the excess air. Place this bag inside another plastic bag. Slide the bag into a pillowcase or place a damp towel between your skin and the bag. °SEEK MEDICAL CARE IF: °· You develop white spots on your skin. This may give the skin a blotchy (mottled) appearance. °· Your skin turns blue or pale. °· Your skin becomes waxy or hard. °· Your swelling gets worse. °MAKE SURE   YOU:  °· Understand these instructions. °· Will watch your condition. °· Will get help right away if you are not doing well or get worse. °  °This information is not intended to replace advice given to you by your health care provider. Make sure you discuss any questions you have with your health care provider. °  °Document Released: 04/11/2011 Document Revised: 09/05/2014 Document Reviewed: 04/11/2011 °Elsevier Interactive Patient Education ©2016 Elsevier Inc. ° °
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
